# Patient Record
Sex: Male | Born: 1975 | Race: White | Hispanic: No | Marital: Single
Health system: Southern US, Community
[De-identification: ages and names within clinical notes are randomized; demographics above are authoritative.]

---

## 2013-06-02 ENCOUNTER — Emergency Department: Payer: Self-pay | Admitting: Emergency Medicine

## 2013-06-02 LAB — CBC
HCT: 47.4 % (ref 40.0–52.0)
HGB: 16.3 g/dL (ref 13.0–18.0)
MCHC: 34.4 g/dL (ref 32.0–36.0)
RBC: 5.44 10*6/uL (ref 4.40–5.90)
WBC: 7.2 10*3/uL (ref 3.8–10.6)

## 2013-06-03 LAB — LIPASE, BLOOD: Lipase: 91 U/L (ref 73–393)

## 2013-06-03 LAB — COMPREHENSIVE METABOLIC PANEL
Bilirubin,Total: 0.5 mg/dL (ref 0.2–1.0)
Calcium, Total: 9.1 mg/dL (ref 8.5–10.1)
Chloride: 106 mmol/L (ref 98–107)
Co2: 29 mmol/L (ref 21–32)
Creatinine: 1.28 mg/dL (ref 0.60–1.30)
EGFR (Non-African Amer.): >60
Osmolality: 284 (ref 275–301)
Potassium: 3.7 mmol/L (ref 3.5–5.1)
Sodium: 140 mmol/L (ref 136–145)

## 2013-06-03 LAB — URINALYSIS, COMPLETE
Glucose,UR: NEGATIVE mg/dL (ref 0–75)
Ketone: NEGATIVE
Leukocyte Esterase: NEGATIVE
Protein: NEGATIVE
RBC,UR: 1 /HPF (ref 0–5)
Specific Gravity: 1.027 (ref 1.003–1.030)
Squamous Epithelial: NONE SEEN

## 2013-06-07 ENCOUNTER — Emergency Department: Payer: Self-pay | Admitting: Emergency Medicine

## 2013-06-23 ENCOUNTER — Ambulatory Visit: Payer: Self-pay | Admitting: Surgery

## 2013-06-23 LAB — HEPATIC FUNCTION PANEL A (ARMC)
Albumin: 3.7 g/dL (ref 3.4–5.0)
Alkaline Phosphatase: 81 U/L (ref 50–136)
SGOT(AST): 53 U/L — ABNORMAL HIGH (ref 15–37)
Total Protein: 7.6 g/dL (ref 6.4–8.2)

## 2013-06-23 LAB — CBC WITH DIFFERENTIAL/PLATELET
Basophil %: 0.8 %
Eosinophil #: 0.1 10*3/uL (ref 0.0–0.7)
HGB: 16 g/dL (ref 13.0–18.0)
Lymphocyte #: 1.7 10*3/uL (ref 1.0–3.6)
Lymphocyte %: 23.6 %
MCH: 30.2 pg (ref 26.0–34.0)
MCHC: 34.7 g/dL (ref 32.0–36.0)
MCV: 87 fL (ref 80–100)
Neutrophil %: 68.1 %
RBC: 5.31 10*6/uL (ref 4.40–5.90)

## 2013-06-29 ENCOUNTER — Ambulatory Visit: Payer: Self-pay | Admitting: Surgery

## 2013-06-30 LAB — PATHOLOGY REPORT

## 2013-07-30 ENCOUNTER — Inpatient Hospital Stay: Payer: Self-pay | Admitting: Surgery

## 2013-07-30 LAB — COMPREHENSIVE METABOLIC PANEL
Alkaline Phosphatase: 265 U/L — ABNORMAL HIGH (ref 50–136)
Anion Gap: 6 — ABNORMAL LOW (ref 7–16)
BUN: 8 mg/dL (ref 7–18)
Calcium, Total: 8.7 mg/dL (ref 8.5–10.1)
Co2: 25 mmol/L (ref 21–32)
EGFR (African American): >60
EGFR (Non-African Amer.): >60
Osmolality: 272 (ref 275–301)
SGPT (ALT): 752 U/L — ABNORMAL HIGH (ref 12–78)
Sodium: 137 mmol/L (ref 136–145)

## 2013-07-30 LAB — CBC
HCT: 47.6 % (ref 40.0–52.0)
HGB: 16.5 g/dL (ref 13.0–18.0)
MCH: 30.6 pg (ref 26.0–34.0)
MCV: 88 fL (ref 80–100)
Platelet: 124 10*3/uL — ABNORMAL LOW (ref 150–440)
RBC: 5.39 10*6/uL (ref 4.40–5.90)
WBC: 5.5 10*3/uL (ref 3.8–10.6)

## 2013-07-30 LAB — MAGNESIUM: Magnesium: 1.8 mg/dL

## 2013-07-31 LAB — COMPREHENSIVE METABOLIC PANEL
Alkaline Phosphatase: 261 U/L — ABNORMAL HIGH (ref 50–136)
Anion Gap: 7 (ref 7–16)
Calcium, Total: 8.6 mg/dL (ref 8.5–10.1)
Chloride: 105 mmol/L (ref 98–107)
Creatinine: 0.73 mg/dL (ref 0.60–1.30)
EGFR (African American): >60
EGFR (Non-African Amer.): >60
Potassium: 3.7 mmol/L (ref 3.5–5.1)
SGOT(AST): 133 U/L — ABNORMAL HIGH (ref 15–37)
Sodium: 136 mmol/L (ref 136–145)
Total Protein: 6.5 g/dL (ref 6.4–8.2)

## 2013-07-31 LAB — CBC WITH DIFFERENTIAL/PLATELET
Eosinophil #: 0.2 10*3/uL (ref 0.0–0.7)
HCT: 43.2 % (ref 40.0–52.0)
Lymphocyte #: 1 10*3/uL (ref 1.0–3.6)
Lymphocyte %: 17.2 %
MCV: 88 fL (ref 80–100)
Monocyte #: 0.5 x10 3/mm (ref 0.2–1.0)
Neutrophil #: 3.9 10*3/uL (ref 1.4–6.5)
RDW: 15.2 % — ABNORMAL HIGH (ref 11.5–14.5)

## 2013-07-31 LAB — PROTIME-INR
INR: 0.9
Prothrombin Time: 12.6 secs (ref 11.5–14.7)

## 2013-08-01 LAB — COMPREHENSIVE METABOLIC PANEL
Alkaline Phosphatase: 241 U/L — ABNORMAL HIGH (ref 50–136)
BUN: 11 mg/dL (ref 7–18)
Bilirubin,Total: 3.1 mg/dL — ABNORMAL HIGH (ref 0.2–1.0)
Calcium, Total: 8.6 mg/dL (ref 8.5–10.1)
Co2: 27 mmol/L (ref 21–32)
Creatinine: 0.77 mg/dL (ref 0.60–1.30)
EGFR (African American): >60
EGFR (Non-African Amer.): >60
Glucose: 122 mg/dL — ABNORMAL HIGH (ref 65–99)
Osmolality: 275 (ref 275–301)
SGOT(AST): 91 U/L — ABNORMAL HIGH (ref 15–37)
SGPT (ALT): 408 U/L — ABNORMAL HIGH (ref 12–78)
Sodium: 137 mmol/L (ref 136–145)
Total Protein: 6.4 g/dL (ref 6.4–8.2)

## 2013-08-01 LAB — LIPASE, BLOOD: Lipase: 107 U/L (ref 73–393)

## 2013-08-01 LAB — URINE CULTURE

## 2014-10-07 IMAGING — US ABDOMEN ULTRASOUND LIMITED
1 series · 14 of 25 positions shown · non-contrast
Comparison: none

REASON FOR EXAM: ruq ttp and pain
COMMENTS:   Body Site: GB and Fossa, CBD, Head of Pancreas

PROCEDURE:     US  - US ABDOMEN LIMITED SURVEY  - June 03, 2013 [DATE]
RESULT:     Comparison: None
TECHNIQUE: Multiple gray-scale and color-flow Doppler images of the right
upper quadrant are presented for review.

[Series 1: abdomen ultrasound limited · 0.30mm/px · 14 of 49 slices shown]
[im 1/49]
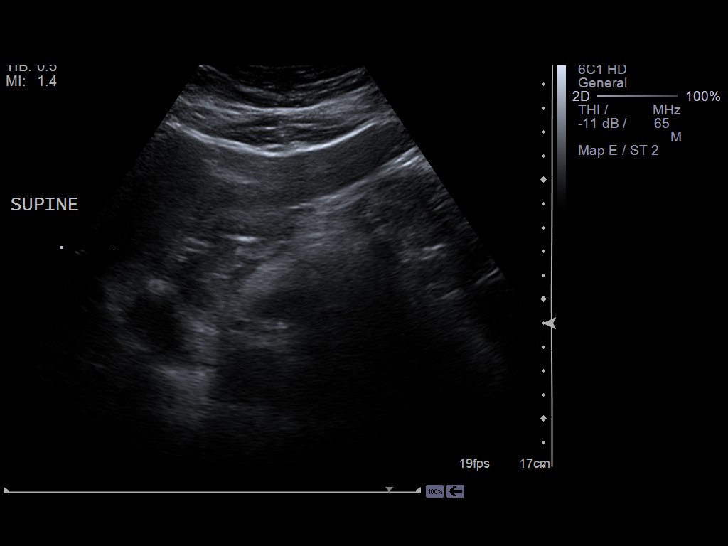
[im 5/49]
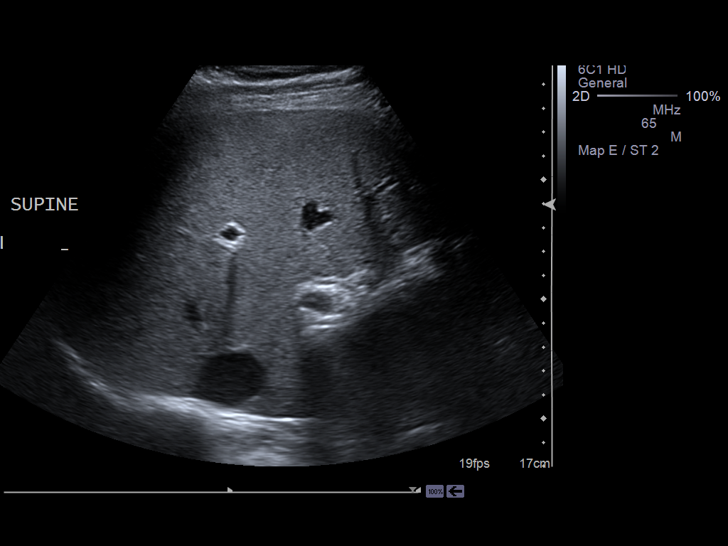
[im 9/49]
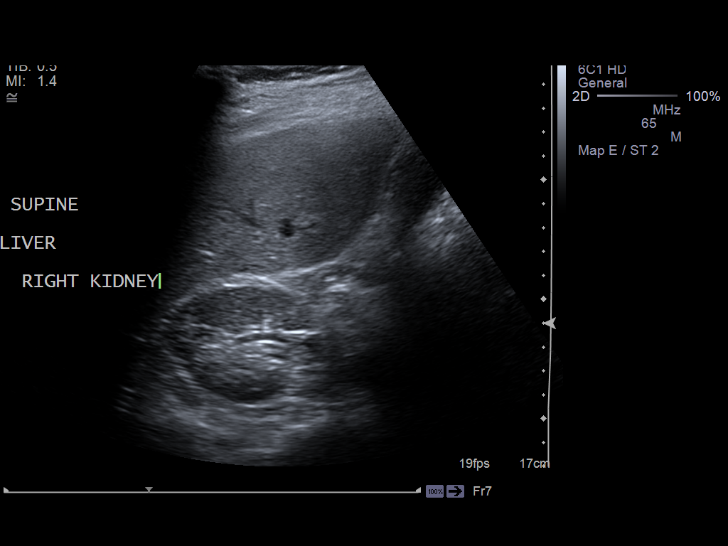
[im 13/49]
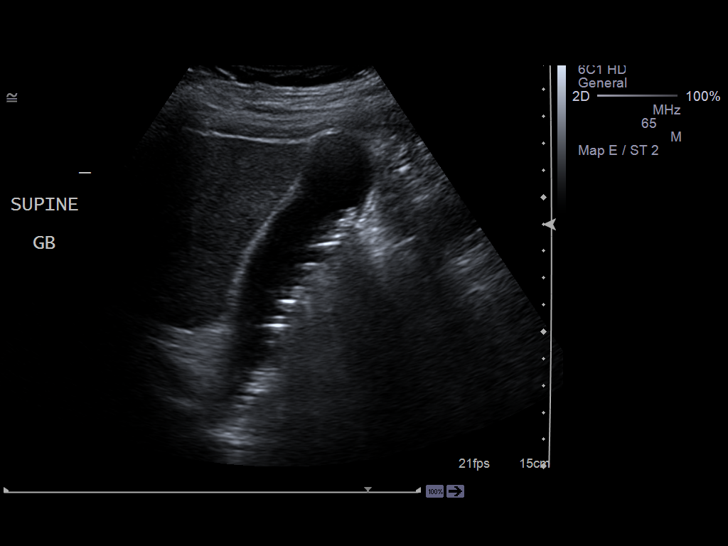
[im 17/49]
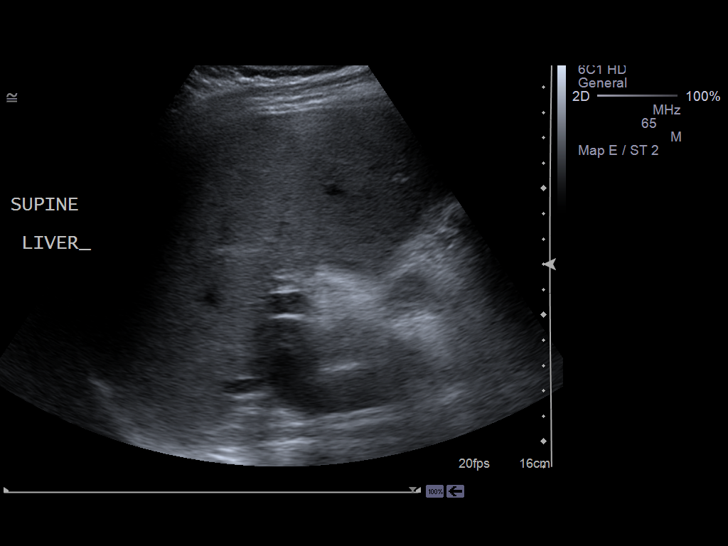
[im 19/49]
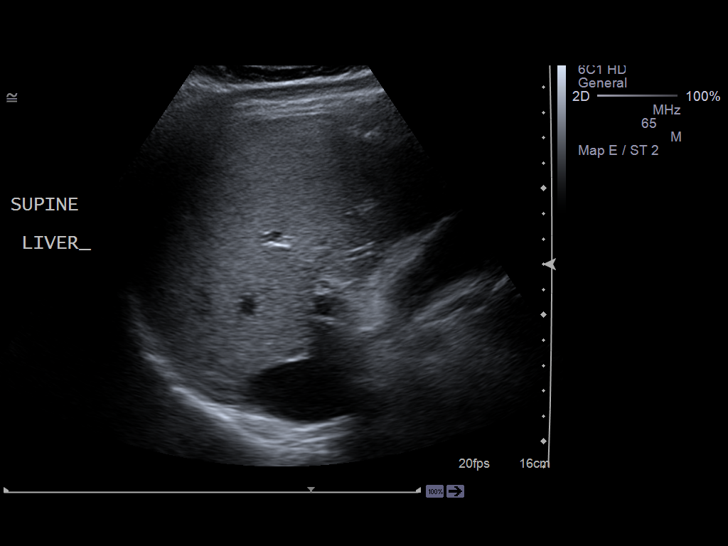
[im 23/49]
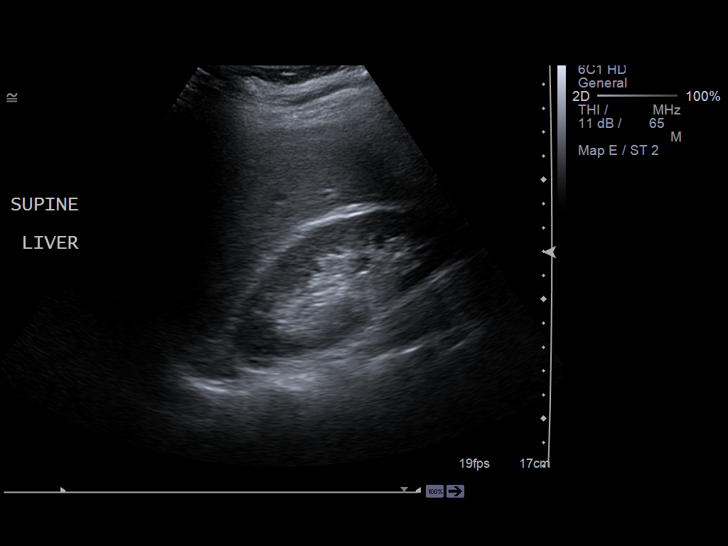
[im 27/49]
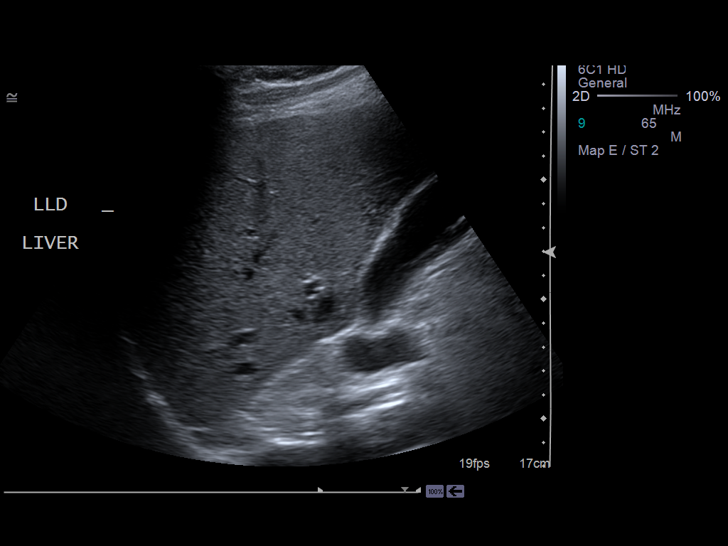
[im 31/49]
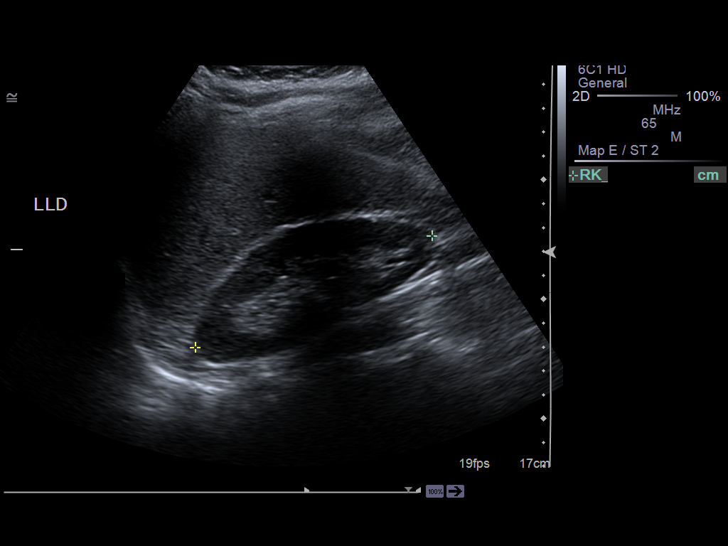
[im 33/49]
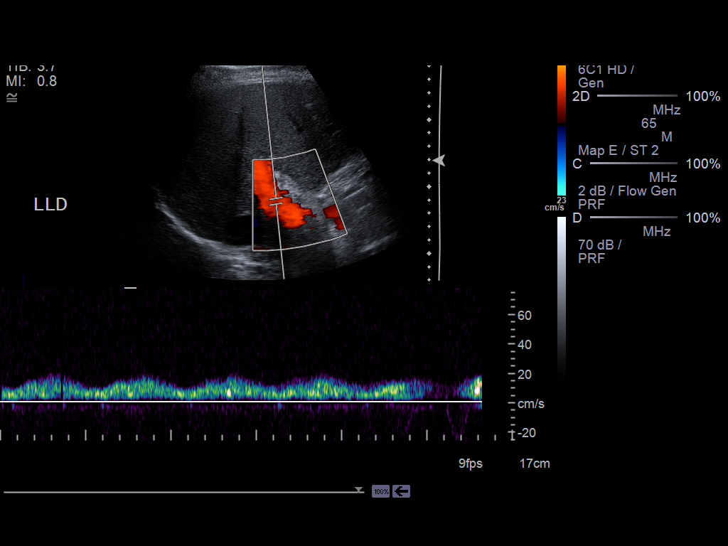
[im 37/49]
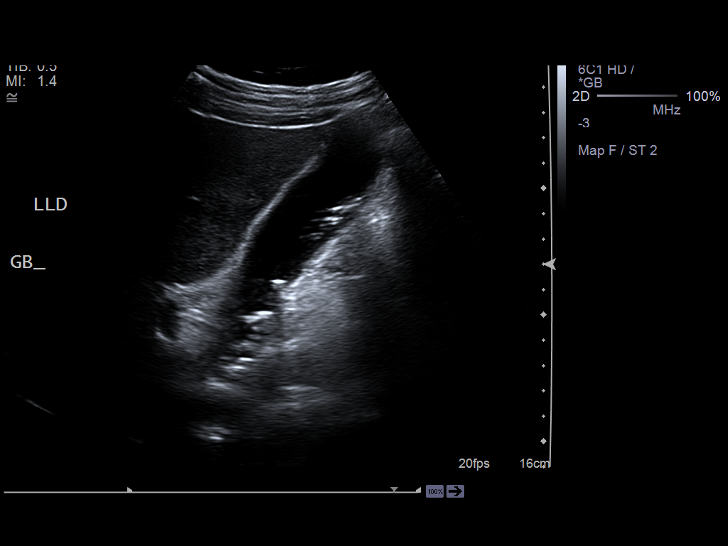
[im 41/49]
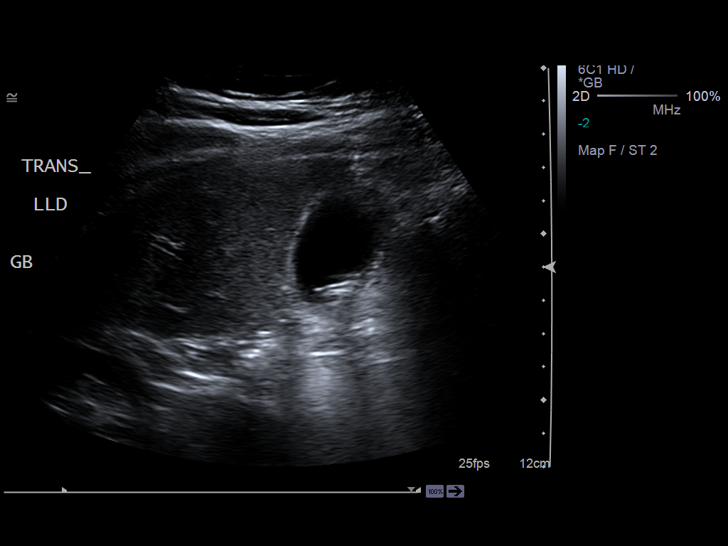
[im 45/49]
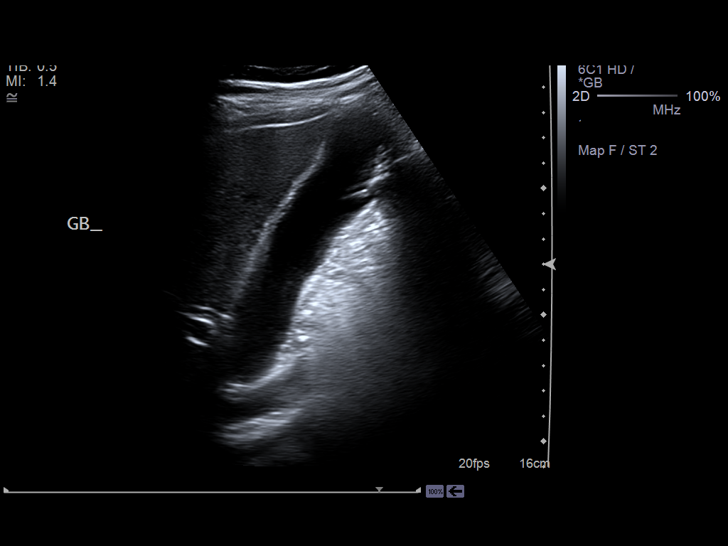
[im 49/49]
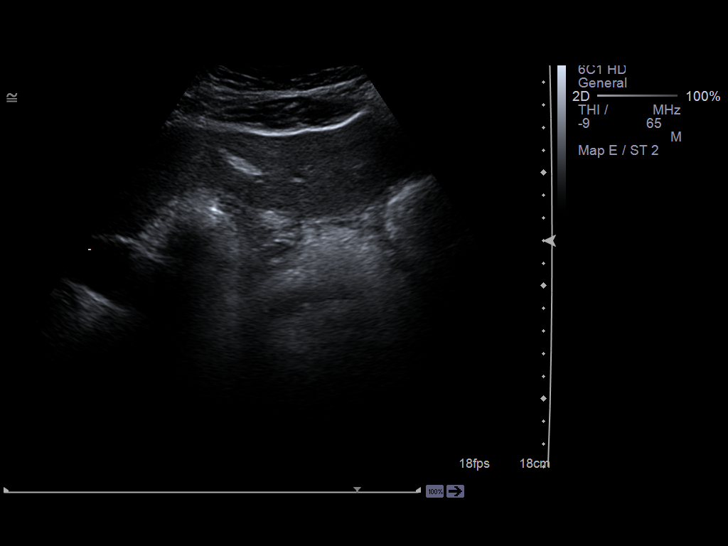

[14 of 25 positions shown; findings below may reference images not displayed]

FINDINGS: Visualized portions of the liver demonstrate normal echogenicity and normal
contours. The liver is without evidence of a focal hepatic lesion.

Cholelithiasis are present. There is no intra- or extrahepatic biliary
ductal dilatation. The common duct measures 3.9 mm in maximal diameter.
There is no gallbladder wall thickening, pericholecystic fluid, or
sonographic Murphy's sign.

The pancreas is not visualized secondary to overlying bowel gas.
IMPRESSION: Cholelithiasis without sonographic evidence of acute cholecystitis.

[REDACTED]

## 2014-12-04 IMAGING — US ABDOMEN ULTRASOUND LIMITED
1 series · 14 of 25 positions shown · non-contrast
Comparison: none

REASON FOR EXAM: epigastric pain, jaundice, hx of chole 1 mo ago
COMMENTS:   Body Site: GB and Fossa, CBD, Head of Pancreas

[Series 1: abdomen ultrasound limited · 0.30mm/px · 14 of 58 slices shown]
[im 1/58]
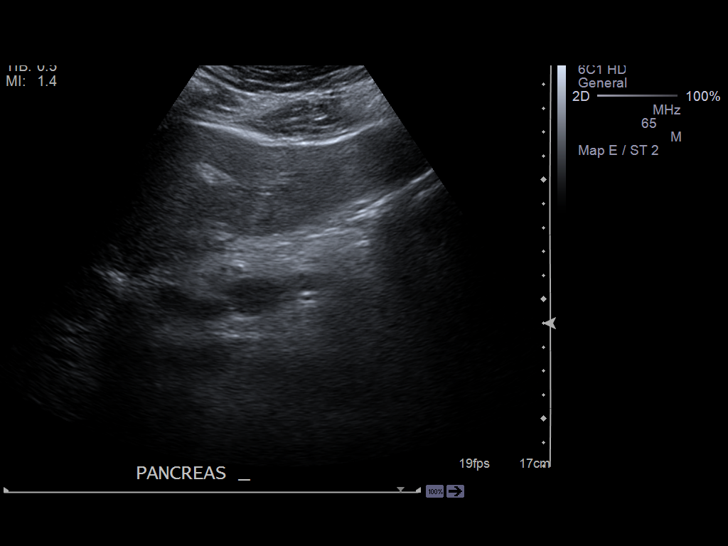
[im 5/58]
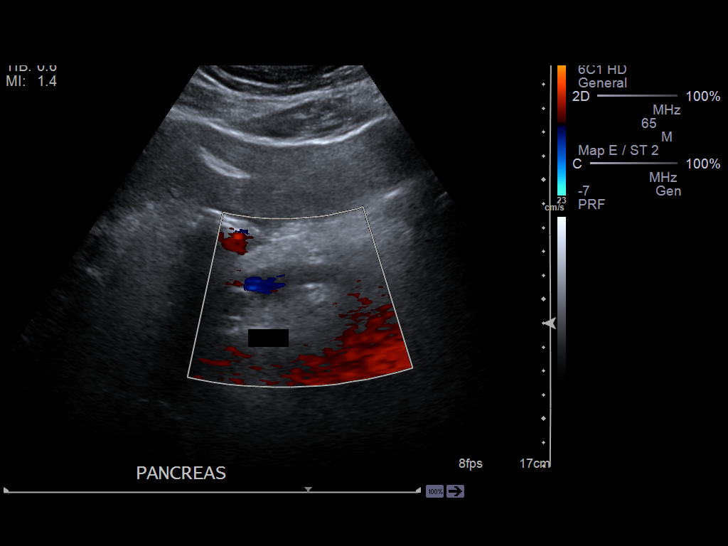
[im 10/58]
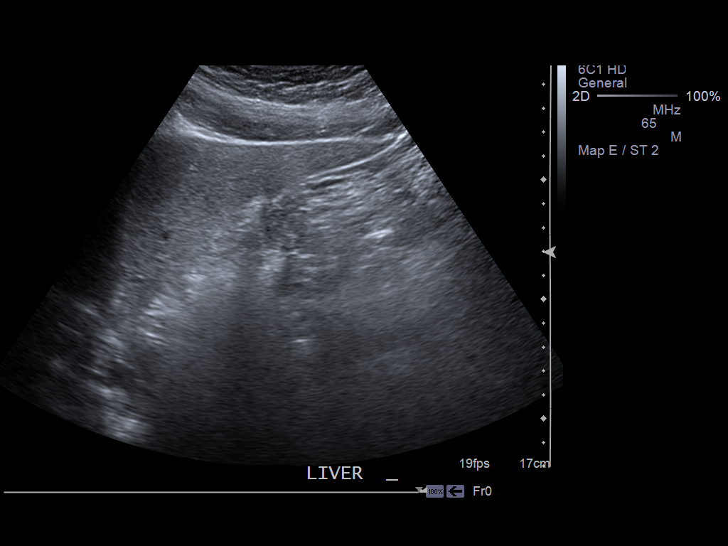
[im 15/58]
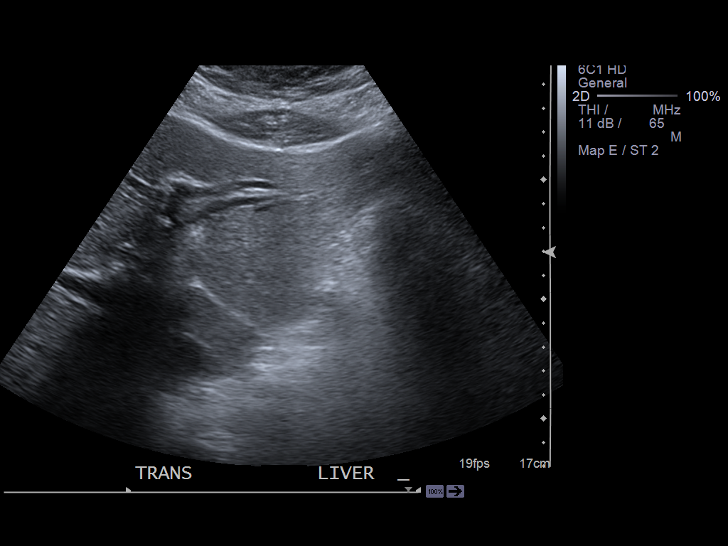
[im 20/58]
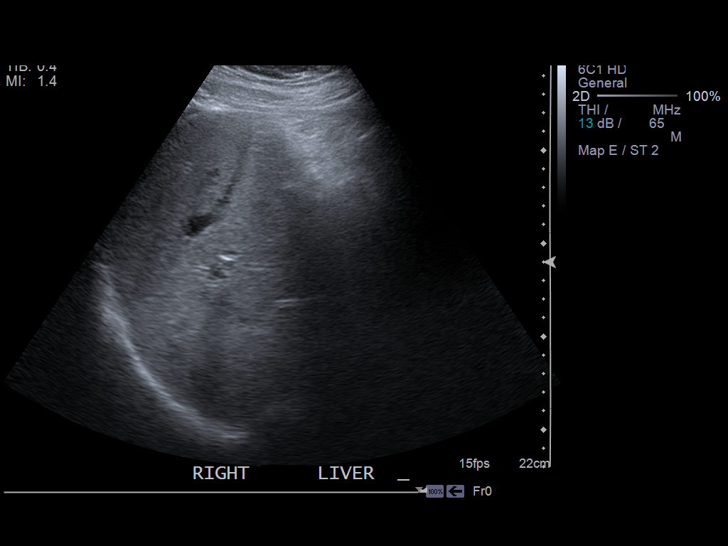
[im 22/58]
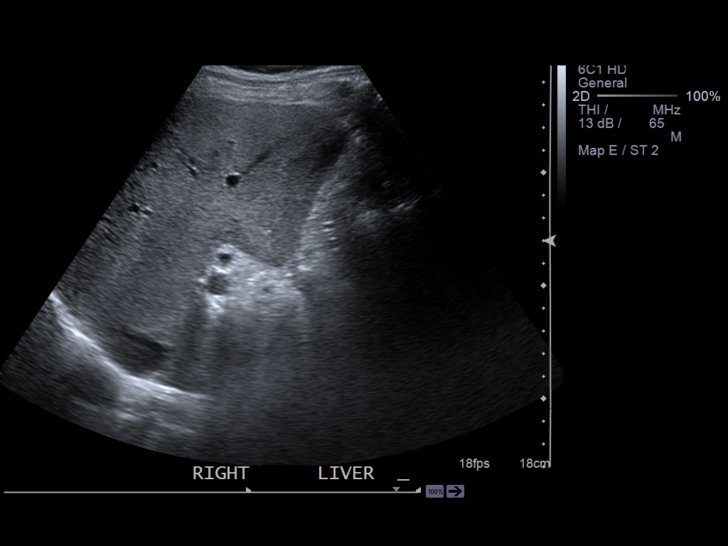
[im 27/58]
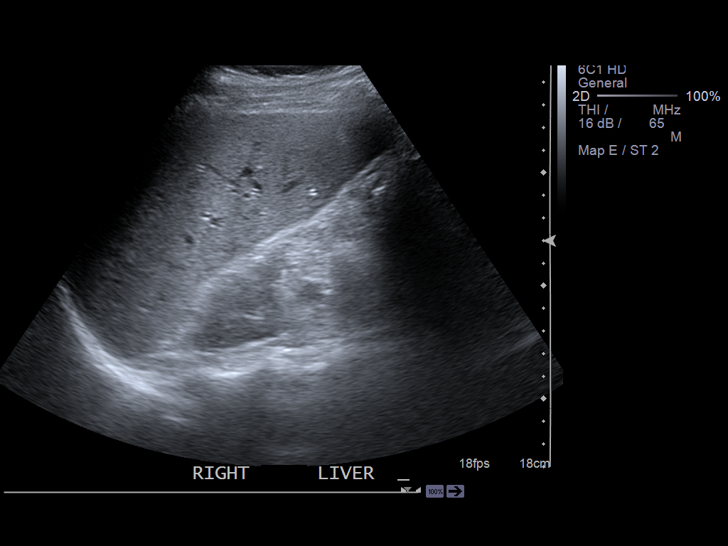
[im 31/58]
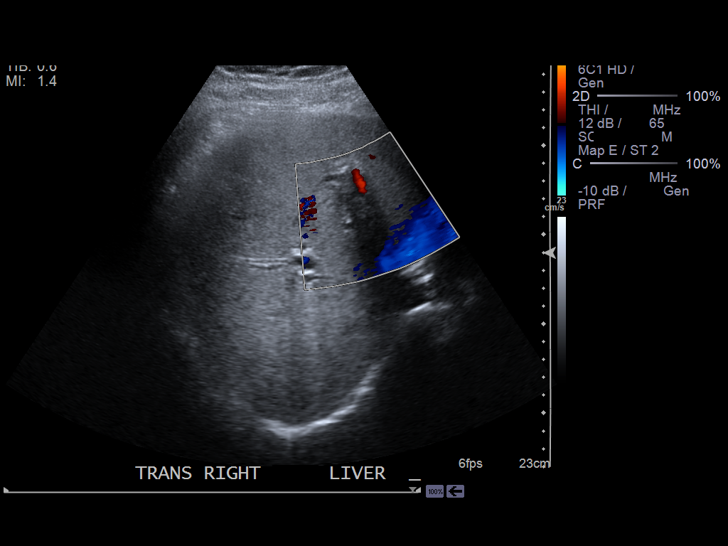
[im 36/58]
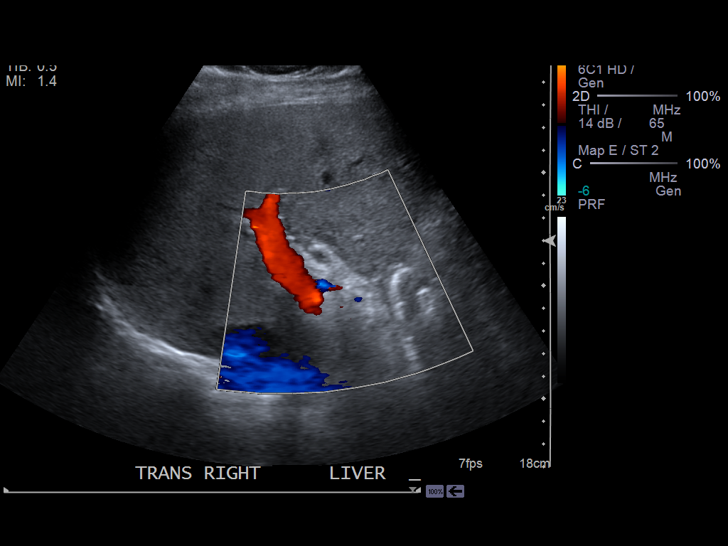
[im 39/58]
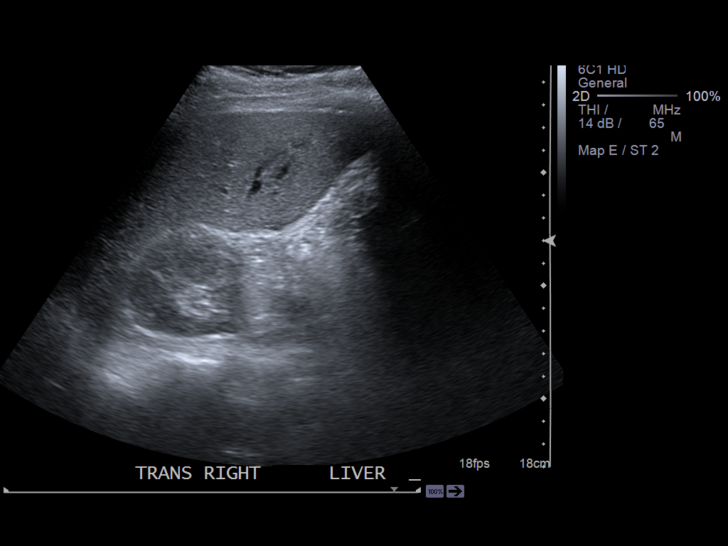
[im 43/58]
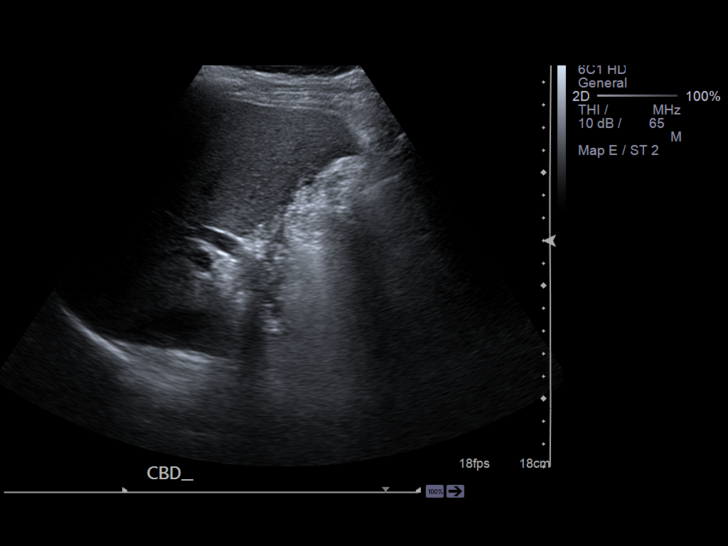
[im 48/58]
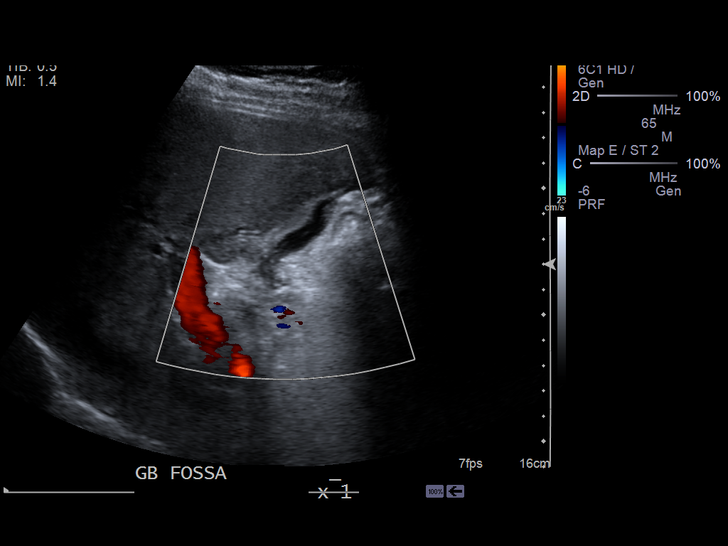
[im 53/58]
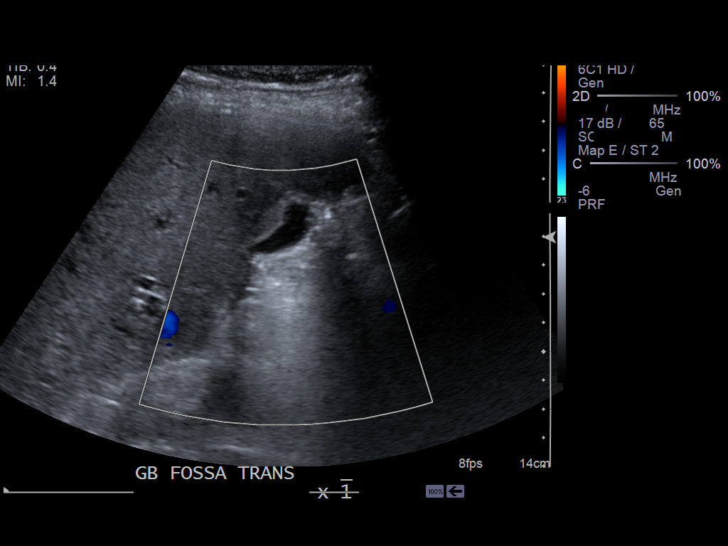
[im 58/58]
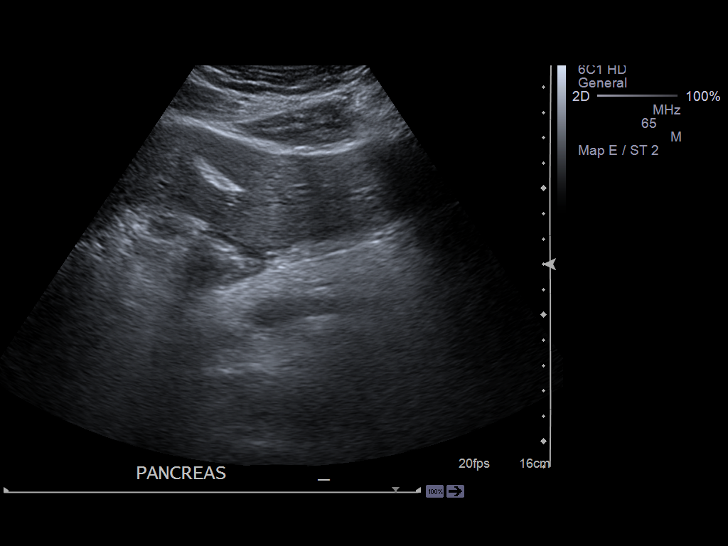

[14 of 25 positions shown; findings below may reference images not displayed]

PROCEDURE:     US  - US ABDOMEN LIMITED SURVEY  - July 30, 2013 [DATE]

RESULT:     The liver is normal in echotexture and contour. There is a small
amount of intrahepatic ductal dilation. There is no focal mass. Portal
venous flow is normal in direction toward the liver. The gallbladder is
surgically absent. There is a small fluid collection within the gallbladder
fossa which measures 3.4 x 0.9 x 2.3 cm. The common bile duct measures 10 mm
in diameter at the level of the pancreatic head. The pancreas exhibits no
focal mass.
IMPRESSION: 1. There is mild dilation of the common bile duct and there is mild
intrahepatic ductal dilation. These findings are not unusual in the
postcholecystectomy state.
2. There is a small fluid collection in the gallbladder fossa which is
nonspecific.
3. The pancreas is normal in appearance.

Followup CT scanning of the abdomen with IV and oral contrast may be useful.

[REDACTED]

## 2015-03-23 NOTE — Consult Note (Signed)
Pt seen and examined. Full consult to follow. S/P cholecystectomy 1 month ago for cholelithiasis. Now, 2 day hx. of nausea, abd pain, jaundice. U/S and labs suggest retained CBD stone. Feeling better today with pain and nausea meds, and Abx. Yet, LFT remains elevated. Discussed ERCP in detail, incl pancreatitis, failure to cannulate, etc. Pt understands. Plan to go ahead with ERCP once we know when and if anesthesia is available. Thanks.  Electronic Signatures: Lutricia Feilh, Shamel Germond (MD)  (Signed on 31-Aug-14 08:39)  Authored  Last Updated: 31-Aug-14 08:39 by Lutricia Feilh, Laila Myhre (MD)

## 2015-03-23 NOTE — Consult Note (Signed)
Chief Complaint:  Subjective/Chief Complaint Feeling better. Min abd pain now. Urine looking normal now. LFT much improved.   VITAL SIGNS/ANCILLARY NOTES: *Intake and Output.:   Daily 01-Sep-14 07:00  Grand Totals Intake:  5638 Output:  900    Net:  9373 42 Hr.:  8768  Oral Intake      In:  1520  IV (Primary)      In:  115  IV (Secondary)      In:  100  Urine ml     Out:  900  Length of Stay Totals Intake:  2674 Output:  7262    Net:  0355   Brief Assessment:  GEN no acute distress   Cardiac Regular   Respiratory clear BS   Gastrointestinal Normal   Lab Results: Hepatic:  01-Sep-14 05:05   Bilirubin, Total  3.1  Alkaline Phosphatase  241  SGPT (ALT)  408  SGOT (AST)  91  Total Protein, Serum 6.4  Albumin, Serum  3.1  Routine Chem:  01-Sep-14 05:05   Lipase 107 (Result(s) reported on 01 Aug 2013 at 05:29AM.)  BUN 11  Creatinine (comp) 0.77  Sodium, Serum 137  Potassium, Serum 3.8  Chloride, Serum 105  CO2, Serum 27  Calcium (Total), Serum 8.6  Osmolality (calc) 275  eGFR (African American) >60  eGFR (Non-African American) >60 (eGFR values <76m/min/1.73 m2 may be an indication of chronic kidney disease (CKD). Calculated eGFR is useful in patients with stable renal function. The eGFR calculation will not be reliable in acutely ill patients when serum creatinine is changing rapidly. It is not useful in  patients on dialysis. The eGFR calculation may not be applicable to patients at the low and high extremes of body sizes, pregnant women, and vegetarians.)  Anion Gap  5   Assessment/Plan:  Assessment/Plan:  Assessment Retained CBD stone post cholecystectomy. CBD stone extracted 8/31 through ERCP. Much better. Tolerating diet.   Plan Expect LFT to normalize over next few days. Will sign  off. Thanks.   Electronic Signatures: OVerdie Shire(MD)  (Signed 01-Sep-14 09:44)  Authored: Chief Complaint, VITAL SIGNS/ANCILLARY NOTES, Brief Assessment, Lab Results,  Assessment/Plan   Last Updated: 01-Sep-14 09:44 by OVerdie Shire(MD)
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
## 2015-03-23 NOTE — Consult Note (Signed)
39 yr old male presents with severe abdominal pain and jaundice whicvh started Post op day 28 from lap chole done on July 30 for chronic cholecystitis. Admit and transfer to Dr Juliann PulseLundquist service per conversation today.  Empiric Zosyn,  CT ABd Pe,vis with contrast per Surgery New onset HTN:  IV metoprolol given NPO status Tobacco abuse:  conselling given.  Nicoderm patch requested  Electronic Signatures: Duncan Dullullo, Lanita Stammen (MD)  (Signed on 30-Aug-14 13:32)  Authored  Last Updated: 30-Aug-14 13:32 by Duncan Dullullo, Georgia Baria (MD)

## 2015-03-23 NOTE — Op Note (Signed)
PATIENT NAME:  Jorge Carlson, Jorge Carlson MR#:  [id number] DATE OF BIRTH:  [birth year]  DATE OF PROCEDURE:  06/29/2013  PREOPERATIVE DIAGNOSIS: Symptomatic calculus cholecystitis.   POSTOPERATIVE DIAGNOSIS: Symptomatic calculus cholecystitis.   PROCEDURE PERFORMED: Laparoscopic cholecystectomy.   SURGEON: Natale Lay, M.D. FACS  ANESTHESIA: General endotracheal.   FINDINGS: Chronic inflammatory changes and scarification of the gallbladder with cholelithiasis.   SPECIMENS: Gallbladder with contents.   ESTIMATED BLOOD LOSS: 50 mL.   DRAINS: 19 mm Blake drain in [place] pouch.   COUNTS: Lap and needle count correct x2.   DESCRIPTION OF PROCEDURE: With informed consent, supine position, general oral endotracheal anesthesia was induced. The patient's left arm was padded and tucked at his side. Timeout was observed. An infraumbilical transversely-oriented skin incision was fashioned with a scalpel and carried down with sharp dissection to the fascia which was incised in the midline and elevated with Kocher clamps.   The peritoneum was entered sharply. An 0 Vicryl U-stitch was passed on either side of the abdominal midline fascia. A 12 mm blunt Hasson trocar was placed. There were omental adhesions to the anterior midline. This occurred from the previous laparotomy for trauma 25 years ago. Laterally there were two 5 mm trocars placed. I then took down adhesions in the anterior midline of the omentum with sharp dissection and point electrocautery and placed a 5 mm Bladeless trocar in the epigastric region. The omentum was  adherent to the entire gallbladder with mature thick adhesions which were taken down with sharp technique, blunt technique, and point application of cautery. The gallbladder wall was thickened. It was grasped along its fundus and elevated towards the right shoulder.   The Hartmann pouch was able to be identified. Lateral traction was applied. The hepatoduodenal ligament was then dissected,  and a single cystic artery and cystic duct, which was rather short,  was identified. The cystic artery was doubly clipped on the portal side, singly clipped on the gallbladder side, and divided. In the process of taking down the adhesions, several hemoclips were placed at bleeding points along the liver edge of omentum.   The cystic duct was short and rather thick. Appeared to be chronically inflamed. I decided to place an 11 mm trocar through the 5 mm trocar site by expanding the incision and controlling the cystic duct with a [employer] locking clip. Hemoclip was placed in addition to this. The gallbladder was then transected off the cystic duct and taken off the liver with hook electrocautery apparatus. It was placed into an Endocatch bag and retrieved.   In the retrieval of the gallbladder, inspection of the periumbilical port site demonstrated no evidence of bowel injury. Pneumoperitoneum was re-established. The right upper quadrant was irrigated during the case with a total of 3000 mL of warm normal saline. Then 10 mL of Surgiflo with thrombin application was applied to the gallbladder fossa. A 19 mm Blake drain was directed into the space and directed into [place] pouch. Trocars were then removed under direct visualization, the infraumbilical fascial defect being reapproximated with figure-of-eight #0 Vicryl suture in vertical orientation.   A total of 30 mL of 0.25% plain Marcaine was infiltrated along all skin and fascial incisions prior to closure. A 4-0 Vicryl subcuticular was applied to all skin edges. Drain site was secured with nylon suture. The patient was subsequently extubated and taken to the recovery room in stable and satisfactory condition by anesthesia services.     ____________________________ Redge Gainer Egbert Garibaldi, MD mab:np D: 06/29/2013 17:12:23  ET T: 06/29/2013 20:07:08 ET JOB#: [id number]  cc: Loraine LericheMark A. Egbert GaribaldiBird, MD, <Dictator> Shanty Ginty A Mitzi Lilja MD ELECTRONICALLY SIGNED 07/02/2013 15:12

## 2015-03-23 NOTE — Consult Note (Signed)
ERCP performed. 1 stone in CBD. THis was removed after biliary sphincterotomy and balloon sweep. Pt tolerated the procedure. Keep patient on clear liquid diet rest of today. Recheck LFT in AM. thanks  Electronic Signatures: Lutricia Feilh, Markiyah Gahm (MD)  (Signed on 31-Aug-14 10:57)  Authored  Last Updated: 31-Aug-14 10:57 by Lutricia Feilh, Detravion Tester (MD)
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
## 2015-03-23 NOTE — Discharge Summary (Signed)
PATIENT NAME:  Jorge Carlson, [name] MR#:  [id number] DATE OF BIRTH:  Nov 11, 1976  DATE OF ADMISSION:  07/30/2013 DATE OF DISCHARGE:  08/01/2013  DISCHARGE DIAGNOSES: 1.  Choledocholithiasis.  2.  History of cholecystectomy with Dr. Egbert GaribaldiBird 06/29/2013.  3.  History of spleen surgery from trauma.  4.  History of appendectomy.   DISCHARGE INSTRUCTIONS: 1.  Tagamet 200 mg 2 tabs p.o. b.i.d. p.r.n.  2.  Benadryl 1% topical cream to apply to affected areas t.i.d.  3.  Prednisone 10 mg p.o. daily. 4.  Percocet 1 tab p.o. q. 6 hours p.r.n. pain.   PROCEDURE PERFORMED: ERCP with stone extraction 07/31/2013.   INDICATION FOR ADMISSION: [name] is a pleasant 39 year old with history of cholecystectomy who presented with jaundice and elevated bilirubin as well as abdominal pain. Noted to have a dilated duct and was admitted for ERCP with stone extraction.   HOSPITAL COURSE: [name] was admitted and resuscitated. He underwent ERCP on 08/31 with removal of stone. On 09/01 his pain was much improved and his bilirubin was improving so he was discharged home in satisfactory condition.   DISCHARGE INSTRUCTIONS: [name] is to follow up as an outpatient. He is to call or return to the ED if he has increased pain, nausea, vomiting, diarrhea or fevers.  ____________________________ Si Raiderhristopher A. Neidy Guerrieri, MD cal:sb D: 08/09/2013 02:56:24 ET T: 08/09/2013 07:20:52 ET JOB#: [id number]  cc: Cristal Deerhristopher A. Aiyla Baucom, MD, <Dictator> Jarvis NewcomerHRISTOPHER A Jisell Majer MD ELECTRONICALLY SIGNED 08/09/2013 19:45

## 2015-03-23 NOTE — H&P (Signed)
PATIENT NAME:  Jorge Carlson, Jorge Carlson MR#:  [id number] DATE OF BIRTH:  26-Mar-1976  DATE OF ADMISSION:  07/30/2013  ATTENDING PHYSICIAN:  Harrell Gave A. Malahki Gasaway, M.D.  ADMISSION DIAGNOSIS:  Two days of epigastric pain, nausea, vomiting, diarrhea.   HISTORY OF PRESENT ILLNESS:  Jorge Carlson is a pleasant 39 year old male who one month ago underwent laparoscopic cholecystectomy with Jorge Carlson. The gallbladder was inflamed, but otherwise, he recovered unremarkably. Approximately two days ago he developed acute onset epigastric pain which he said was horrible, it did is not improved. He also had nausea and vomiting associated with pain as well as diarrhea, also subjective fevers and chills. He has never had pain like this before. He also notes that his eyes were markedly yellow. He presented to the Emergency Room and was found to have a hyperbilirubinemia and possible mildly dilated common bile ducts, concern for possibly retained common bile duct stone. Otherwise no night sweats, shortness of breath, chest pain, cough, dysuria, or hematuria.   PAST MEDICAL HISTORY:  1.  A history of gallbladder removal with Dr. Sherri Rad, 06/29/2013.  2.  A history of spleen surgery from a trauma.  3.  A history of appendectomy.   HOME MEDICATIONS:  1.  Tagamet.  2.  Benadryl.   ALLERGIES:  No known drug allergies.   SOCIAL HISTORY:  Denies significant alcohol use, smokes approximately 1/2 pack per day cigarettes.   PAST MEDICAL HISTORY:  Significant for diabetes, hypertension, Alzheimer's, Parkinson's.   REVIEW OF SYSTEMS:  A 12-point review of systems obtained. Pertinent positives and negatives as above.   PHYSICAL EXAMINATION:  VITAL SIGNS:  Temperature 97.9, pulse 89, blood pressure 146/80, respirations 20, 97% on room air.  GENERAL:  No acute distress, alert and oriented x 3.  HEAD:  Normocephalic, atraumatic.  EYES:  Plus scleral icterus. No conjunctivitis.  FACE:  No obvious facial trauma. Normal  external nose. Normal external ears.  CHEST:  Lungs clear to auscultation. Moving air well. HEART:  Regular rate and rhythm. No murmurs, rubs or gallops.  ABDOMEN:  Soft, not significantly tender to palpation, nondistended. Does have midline incision from previous trauma surgery as well as a well-healed cholecystectomy incisions and drainage site.  EXTREMITIES:  Moves all extremities well. Strength 5/5.  NEUROLOGIC:  Cranial nerves II through XII grossly intact.   LABORATORY DATA:  Labs have been reviewed. Significant for a white cell count of 5.5, hemoglobin 16.5, hematocrit 47.6, platelets 124. CMP is normal. Bilirubin is 8.6, alk phos of 265. AST is 222, ALT is 752. Ultrasound shows mildly dilated common bile duct, it is measuring 10 mm at lateral pancreatic head, small fluid collection in fossa measuring 2.4 x 0.9 x 2.3 cm.   ASSESSMENT AND PLAN:  Jorge Carlson is a pleasant 39 year old year-old male who was doing well status post cholecystectomy except for two days ago and now presents with jaundice, elevated LFTs. As he has been doing fine up until recently, I am not concerned of a duct injury. I more concern for a retained common bile duct stone. I have spoken with Dr. Candace Cruise, who will plan ERCP. Per his recommendation, no further imaging is necessary. I have spoken to the patient and he agrees with this plan. We will allow him to eat until midnight for possible ERCP tomorrow. We will place on Zosyn for prophylaxis. I appreciate Dr. Lupita Dawn assistance with this patient.  ____________________________ Glena Norfolk. Rhaya Coale, MD cal:jm D: 07/30/2013 14:32:34 ET T: 07/30/2013 15:12:46 ET JOB#: [id number]  cc: Harrell Gave A. Rollyn Scialdone, MD, <Dictator> Floyde Parkins MD ELECTRONICALLY SIGNED 08/05/2013 8:12
